# Patient Record
Sex: Female | Born: 1965 | Race: White | Hispanic: No | Smoking: Current every day smoker
Health system: Southern US, Community
[De-identification: ages and names within clinical notes are randomized; demographics above are authoritative.]

## PROBLEM LIST (undated history)

## (undated) HISTORY — PX: CERVICAL FUSION: SHX112

## (undated) HISTORY — PX: ABDOMINAL HYSTERECTOMY: SHX81

---

## 2005-06-16 ENCOUNTER — Ambulatory Visit (HOSPITAL_COMMUNITY): Admission: RE | Admit: 2005-06-16 | Discharge: 2005-06-16 | Payer: Self-pay | Admitting: Neurological Surgery

## 2005-07-18 ENCOUNTER — Encounter: Admission: RE | Admit: 2005-07-18 | Discharge: 2005-07-18 | Payer: Self-pay | Admitting: Neurological Surgery

## 2009-12-31 ENCOUNTER — Encounter: Admission: RE | Admit: 2009-12-31 | Discharge: 2009-12-31 | Payer: Self-pay | Admitting: Family Medicine

## 2010-04-29 ENCOUNTER — Encounter
Admission: RE | Admit: 2010-04-29 | Discharge: 2010-04-29 | Payer: Self-pay | Source: Home / Self Care | Attending: Family Medicine | Admitting: Family Medicine

## 2010-05-04 ENCOUNTER — Encounter
Admission: RE | Admit: 2010-05-04 | Discharge: 2010-05-04 | Payer: Self-pay | Source: Home / Self Care | Attending: Family Medicine | Admitting: Family Medicine

## 2010-05-10 ENCOUNTER — Other Ambulatory Visit: Payer: Self-pay | Admitting: Family Medicine

## 2010-05-10 DIAGNOSIS — M47819 Spondylosis without myelopathy or radiculopathy, site unspecified: Secondary | ICD-10-CM

## 2010-05-18 ENCOUNTER — Ambulatory Visit
Admission: RE | Admit: 2010-05-18 | Discharge: 2010-05-18 | Disposition: A | Discharge status: Home / Self Care | Payer: BC Managed Care – PPO | Source: Ambulatory Visit | Attending: Family Medicine | Admitting: Family Medicine

## 2010-05-18 DIAGNOSIS — M47819 Spondylosis without myelopathy or radiculopathy, site unspecified: Secondary | ICD-10-CM

## 2010-07-05 ENCOUNTER — Other Ambulatory Visit: Payer: Self-pay | Admitting: Family Medicine

## 2010-07-05 DIAGNOSIS — M47819 Spondylosis without myelopathy or radiculopathy, site unspecified: Secondary | ICD-10-CM

## 2010-07-05 DIAGNOSIS — M5416 Radiculopathy, lumbar region: Secondary | ICD-10-CM

## 2010-07-06 ENCOUNTER — Ambulatory Visit
Admission: RE | Admit: 2010-07-06 | Discharge: 2010-07-06 | Disposition: A | Discharge status: Home / Self Care | Payer: Self-pay | Source: Ambulatory Visit | Attending: Family Medicine | Admitting: Family Medicine

## 2010-07-06 DIAGNOSIS — M5416 Radiculopathy, lumbar region: Secondary | ICD-10-CM

## 2010-07-06 DIAGNOSIS — M47819 Spondylosis without myelopathy or radiculopathy, site unspecified: Secondary | ICD-10-CM

## 2010-08-27 NOTE — Op Note (Signed)
NAMETANGANYIKA, BOWLDS              ACCOUNT NO.:  [id number]   MEDICAL RECORD NO.:  [id number]          PATIENT TYPE:  OIB   LOCATION:  3007                         FACILITY:  [facility]   PHYSICIAN:  Tia Alert, MD     DATE OF BIRTH:  Jul 28, 1965   DATE OF PROCEDURE:  06/16/2005  DATE OF DISCHARGE:                                 OPERATIVE REPORT   PREOPERATIVE DIAGNOSIS:  Cervical spondylosis with degenerative disease and  small disk herniation, C5-6 with neck pain and left arm symptoms.   POSTOP DIAGNOSIS:  Cervical spondylosis with degenerative disease and small  disk herniation, C5-6 with neck pain and left arm symptoms.   PROCEDURE:  1.  Decompressive anterior cervical diskectomy, C5-6.  2.  Anterior cervical arthrodesis, C5-6 utilizing a 6 mm trabecular metal      cage packed with local autograft and DBX putty.  3.  Anterior cervical plating C5-6 utilizing a 23-mm Atlantis Vision plate.   SURGEON:  Marikay Alar.   ASSISTANT:  Dr. Aliene Beams   ANESTHESIA:  General endotracheal.   COMPLICATIONS:  None apparent.   INDICATIONS FOR PROCEDURE:  Ms. Onofrio is a 45 year old white female who  is referred with neck pain and left arm numbness and some pain. She had been  followed for over a year with medical management without significant relief.  She had MRI which showed spondylosis with degenerative disease with modic  endplate changes at C5-6 with a small foraminal disk herniation to the left.  I recommended anterior cervical diskectomy and fusion with plating at C5-6.  She understood the risks, benefits, expected outcome and wished to proceed.   DESCRIPTION OF PROCEDURE:  The patient was taken to the operating room after  induction of adequate generalized endotracheal anesthesia, she was placed in  supine position on the operating room table.  Her right anterior cervical  region was prepped with DuraPrep and draped in usual sterile fashion. 5 mL  local anesthesia was  injected and a small incision was made to the right of  midline and carried down to the platysma muscle which was elevated, opened  and undermined with Metzenbaum scissors. I then dissected a plane the medial  to the sternocleidomastoid muscle, internal carotid artery and lateral to  the trachea and esophagus to expose C5-6 and after intraoperative  fluoroscopy confirmed my level and the longus colli muscles taken down and  shadow line retractors were placed, the annulus at C5-6 was incised with a  15 blade scalpel and initial diskectomy was done with pituitary rongeurs and  curved curettes.  I then used the mucous trap to catch the shavings as I  drilled the endplates down to the level of the posterior longitudinal  ligament.  The operating micro microscope was brought onto the field and the  posterior longitudinal ligament was opened with a nerve hook and then  removed in a circumferential fashion along with posterior osteophytes at C5-  6.  Bilateral foraminotomies were performed but particular attention was  paid to the left side where she had her symptoms. The C6 nerve was  identified and  followed out past the pedicle level and generous foraminotomy  was performed.  The central canal was well decompressed.  The dura was  capacious and not pushed away.  We then irrigated with saline solution,  inspected our decompression once again, measured our interspace to be 6 mm  and then used a 6.14 x 11-mm trabecular cage which was packed with local  autograft and DBX putty and tapped into position at C5-6. An anterior  cervical plate was then used.  We used a 23-mm Atlantis Vision plate and  placed two 13 mm variable angle screws in the bodies of C5-C6 and locked  these into position with locking mechanism on the plate. We then irrigated  with saline solution containing bacitracin, dried all bleeding points with  bipolar cautery and once meticulous hemostasis was achieved, we closed the   platysma with 3-0 Vicryl, closed the subcuticular tissue with 3-0 Vicryl and  closed skin with Benzoin and Steri-Strips. The drapes were removed. A  sterile dressing was applied. The patient was awakened from general  anesthesia, transferred to recovery room in stable condition. At the end of  procedure all sponge, needle and instrument counts were correct.      Tia Alert, MD  Electronically Signed     DSJ/MEDQ  D:  06/16/2005  T:  06/16/2005  Job:  [id number]

## 2012-01-24 ENCOUNTER — Ambulatory Visit: Payer: Self-pay | Admitting: Internal Medicine

## 2012-01-24 LAB — DOT URINE DIP
Blood: NEGATIVE
Glucose,UR: NEGATIVE mg/dL (ref 0–75)
Specific Gravity: 1.005 (ref 1.003–1.030)

## 2013-02-13 ENCOUNTER — Other Ambulatory Visit: Payer: Self-pay | Admitting: Neurological Surgery

## 2013-02-13 DIAGNOSIS — M549 Dorsalgia, unspecified: Secondary | ICD-10-CM

## 2013-02-22 ENCOUNTER — Ambulatory Visit
Admission: RE | Admit: 2013-02-22 | Discharge: 2013-02-22 | Disposition: A | Discharge status: Home / Self Care | Payer: BC Managed Care – PPO | Source: Ambulatory Visit | Attending: Neurological Surgery | Admitting: Neurological Surgery

## 2013-02-22 DIAGNOSIS — M549 Dorsalgia, unspecified: Secondary | ICD-10-CM

## 2013-05-01 ENCOUNTER — Other Ambulatory Visit: Payer: Self-pay | Admitting: Anesthesiology

## 2013-05-01 DIAGNOSIS — M549 Dorsalgia, unspecified: Secondary | ICD-10-CM

## 2013-05-06 ENCOUNTER — Ambulatory Visit
Admission: RE | Admit: 2013-05-06 | Discharge: 2013-05-06 | Disposition: A | Discharge status: Home / Self Care | Payer: BC Managed Care – PPO | Source: Ambulatory Visit | Attending: Anesthesiology | Admitting: Anesthesiology

## 2013-05-06 DIAGNOSIS — M549 Dorsalgia, unspecified: Secondary | ICD-10-CM

## 2014-01-03 ENCOUNTER — Emergency Department (HOSPITAL_COMMUNITY)
Admission: EM | Admit: 2014-01-03 | Discharge: 2014-01-03 | Disposition: A | Discharge status: Home / Self Care | Payer: BC Managed Care – PPO | Attending: Emergency Medicine | Admitting: Emergency Medicine

## 2014-01-03 ENCOUNTER — Encounter (HOSPITAL_COMMUNITY): Payer: Self-pay | Admitting: Emergency Medicine

## 2014-01-03 DIAGNOSIS — Z791 Long term (current) use of non-steroidal anti-inflammatories (NSAID): Secondary | ICD-10-CM | POA: Diagnosis not present

## 2014-01-03 DIAGNOSIS — IMO0002 Reserved for concepts with insufficient information to code with codable children: Secondary | ICD-10-CM | POA: Diagnosis not present

## 2014-01-03 DIAGNOSIS — F17200 Nicotine dependence, unspecified, uncomplicated: Secondary | ICD-10-CM | POA: Diagnosis not present

## 2014-01-03 DIAGNOSIS — R599 Enlarged lymph nodes, unspecified: Secondary | ICD-10-CM | POA: Insufficient documentation

## 2014-01-03 DIAGNOSIS — H9209 Otalgia, unspecified ear: Secondary | ICD-10-CM | POA: Insufficient documentation

## 2014-01-03 DIAGNOSIS — J3489 Other specified disorders of nose and nasal sinuses: Secondary | ICD-10-CM | POA: Insufficient documentation

## 2014-01-03 DIAGNOSIS — R591 Generalized enlarged lymph nodes: Secondary | ICD-10-CM

## 2014-01-03 NOTE — ED Provider Notes (Signed)
CSN: [id number]     Arrival date & time 01/03/14  1657 History  This chart was scribed for non-physician practitioner working with Richarda Blade, MD by Mercy Moore, ED Scribe. This patient was seen in room TR05C/TR05C and the patient's care was started at 5:49 PM.   Chief Complaint  Patient presents with  . Otalgia   The history is provided by the patient. No language interpreter was used.   HPI Comments: Shirley Le is a 48 y.o. female who presents to the Emergency Department with chief compliant of throbbing right ear pain for two weeks now. Patient reports sensation of fluid in her left ear. Patient has been treating her symptoms with Oxycodone and Prednisone prescribed at ED in [place]. Patient reports diagnosis of inflamed left lymph node. Last night patient applied a heating pad and reports mild relief. Patient reports rhinorrhea, but denies other cold symptoms including cough and congestion. Patient has appointment with ENT specialist next week. Patient reports history of tubes in her ear. She denies history of Diabetes.   History reviewed. No pertinent past medical history. Past Surgical History  Procedure Laterality Date  . Abdominal hysterectomy    . Cervical fusion     No family history on file. History  Substance Use Topics  . Smoking status: Current Every Day Smoker  . Smokeless tobacco: Not on file  . Alcohol Use: Yes   OB History   Grav Para Term Preterm Abortions TAB SAB Ect Mult Living                 Review of Systems  Constitutional: Negative for fever and chills.  HENT: Positive for ear pain and rhinorrhea. Negative for ear discharge.   Respiratory: Negative for cough.   Hematological: Positive for adenopathy.    Allergies  Chocolate and Codeine  Home Medications   Prior to Admission medications   Medication Sig Start Date End Date Taking? Authorizing Provider  cyclobenzaprine (FLEXERIL) 10 MG tablet Take 10 mg by mouth 3 (three) times daily  as needed for muscle spasms.   Yes Historical Provider, MD  Isopropyl Alcohol (SWIMMERS EAR DROPS OT) Place 4 drops in ear(s) every 6 (six) hours as needed.   Yes Historical Provider, MD  meloxicam (MOBIC) 7.5 MG tablet Take 7.5 mg by mouth daily.   Yes Historical Provider, MD  oxyCODONE-acetaminophen (PERCOCET/ROXICET) 5-325 MG per tablet Take 1 tablet by mouth every 6 (six) hours as needed for severe pain.   Yes Historical Provider, MD  PredniSONE 10 MG KIT Take 10 mg by mouth daily. Day 1: 2 tablets before breakfast, 1 tablet at lunch and supper time, 2 tablets at bedtime.  Day 2: 1 tablet before breakfast, 1 table at lunch and supper time, 2 tablets at bedtime.  Day 3: 1 tablet before breakfast, 1 tablet at lunch and supper time, 1 table at bedtime. Day 4: 1 tablet before breakfast, 1 tablet at lunch and supper time, 1 tablet at bedtime.  Day 5: 1 tablet before breakfast, and at bedtime.  Day 6: 1 tablet before breakfast.   Yes Historical Provider, MD   Triage Vitals: BP 127/83  Pulse 94  Temp(Src) 98.6 F (37 C) (Oral)  Resp 20  SpO2 99%  Physical Exam  Nursing note and vitals reviewed. Constitutional: She is oriented to person, place, and time. She appears well-developed and well-nourished. No distress.  HENT:  Head: Normocephalic and atraumatic.  Bilateral TM are clear, some scarring in noted on left TM,  not fluid collection. Ear canal is clear.  No mastoid tenderness. No erythema, cellulitis or abscess.   Eyes: EOM are normal.  Neck: Normal range of motion. Neck supple.  Left anterior cervical chain lymphadenopathy.   Cardiovascular: Normal rate.   Pulmonary/Chest: Effort normal. No respiratory distress.  Musculoskeletal: Normal range of motion.  Lymphadenopathy:    She has cervical adenopathy.  Neurological: She is alert and oriented to person, place, and time.  Skin: Skin is warm and dry.  Psychiatric: She has a normal mood and affect. Her behavior is normal.    ED Course   Procedures (including critical care time)  COORDINATION OF CARE: 5:55 PM- Patient advised to continue warm compresses and to keep her appointment with the ENT specialist. Patient given return precautions. Discussed treatment plan with patient at bedside and patient agreed to plan.   Labs Review Labs Reviewed - No data to display  Imaging Review No results found.   EKG Interpretation None      MDM   Final diagnoses:  Lymphadenopathy    Patient with tender left anterior cervical chain adenopathy.  No acute findings on ENT exam.  No abscess, TM abnormality.  Has tried prednisone and percocet with no relief.  She has ENT follow-up on Monday.  States that warm compresses help.  Encouraged these for the next couple of days.  I personally performed the services described in this documentation, which was scribed in my presence. The recorded information has been reviewed and is accurate.    Montine Circle, PA-C 01/03/14 1806

## 2014-01-03 NOTE — ED Notes (Signed)
Extreme ear pain. taking oxycodone, prednisone, and an antibiotic.  Started using ear drops for swimmers ear and not working.

## 2014-01-03 NOTE — Discharge Instructions (Signed)

## 2014-01-03 NOTE — ED Provider Notes (Signed)
Medical screening examination/treatment/procedure(s) were performed by non-physician practitioner and as supervising physician I was immediately available for consultation/collaboration.  Jarryd Gratz L Benicio Manna, MD 01/03/14 2325 

## 2014-04-13 IMAGING — CT CT L SPINE W/O CM
4 of 7 series · 12 of 33 positions shown, 14 images · non-contrast
Comparison: MR JIM SPINE W/O dated 02/22/2013

CLINICAL DATA: Diskogram. Low back pain for 3 months. Numbness in
both feet.

EXAM:
CT LUMBAR SPINE WITHOUT CONTRAST
TECHNIQUE: Multidetector CT imaging of the lumbar spine was performed without
intravenous contrast administration. Multiplanar CT image
reconstructions were also generated.

[Series 2: l spine bone · axial · 0.26mm/px · z∈[-57,-17]mm · 2 of 48 slices shown, 3 images]
[im 16/48  soft-tissue]
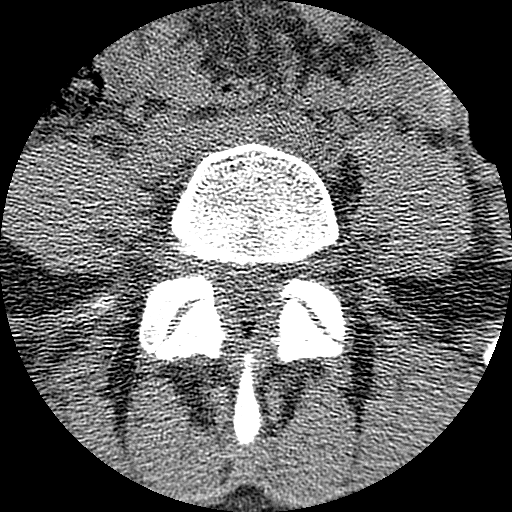
[im 16/48  bone]
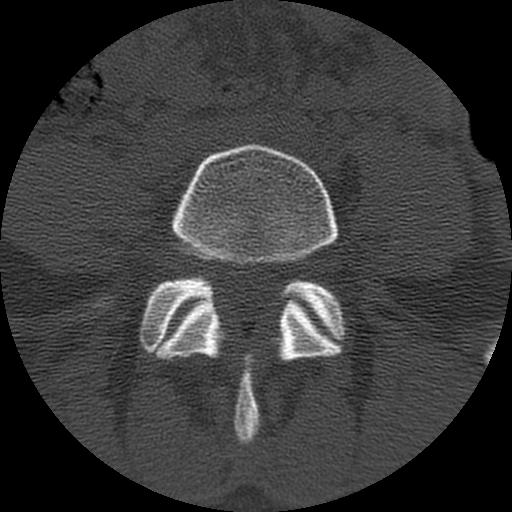
[im 32/48  bone]
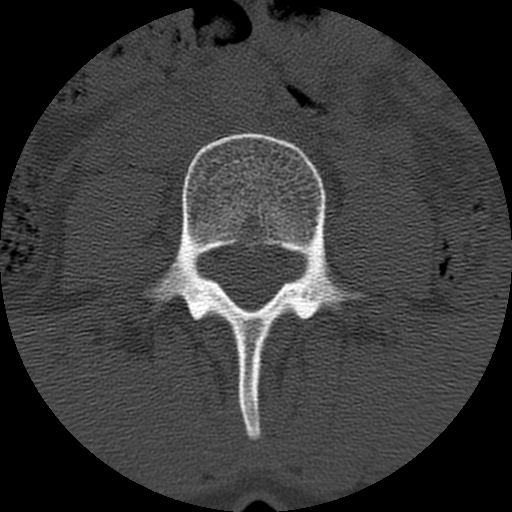

[Series 3: l spine soft · axial · 0.26mm/px · z∈[-57,-17]mm · 2 of 48 slices shown]
[im 16/48  soft-tissue]
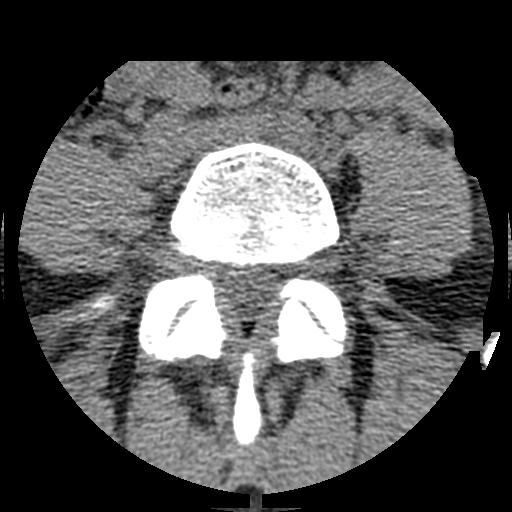
[im 32/48  soft-tissue]
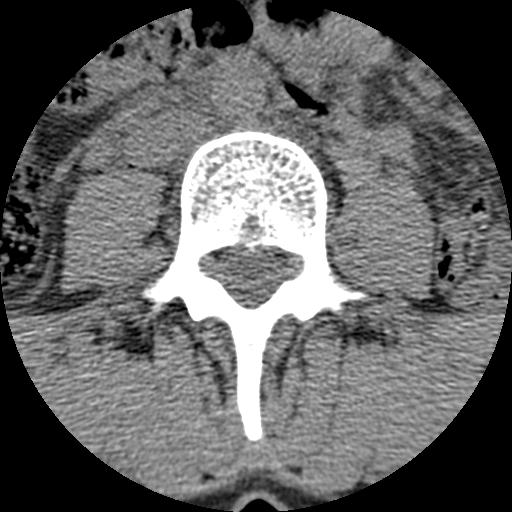

[Series 400: sagittal · sagittal · 0.26mm/px · 5 of 42 slices shown, 6 images]
[im 14/42  bone]
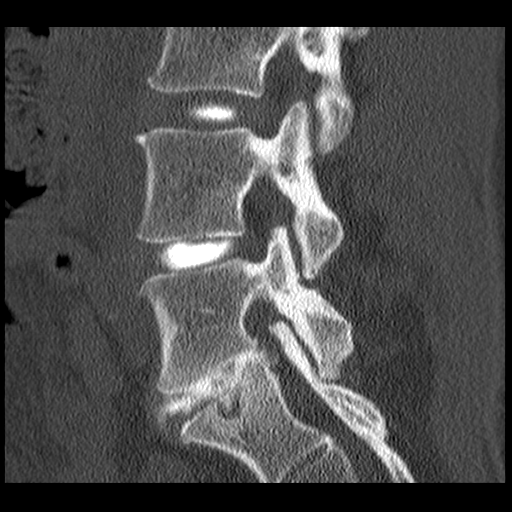
[im 18/42  bone]
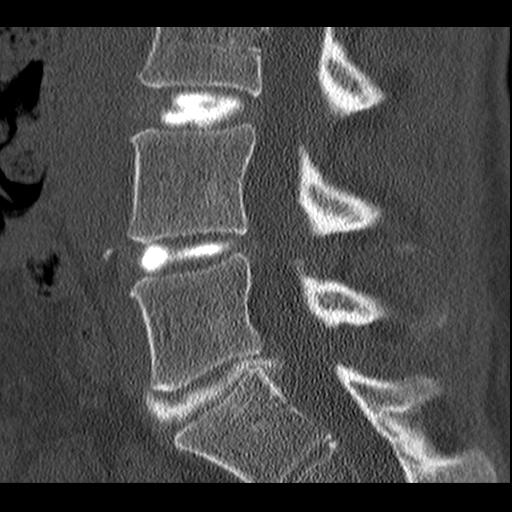
[im 21/42  soft-tissue]
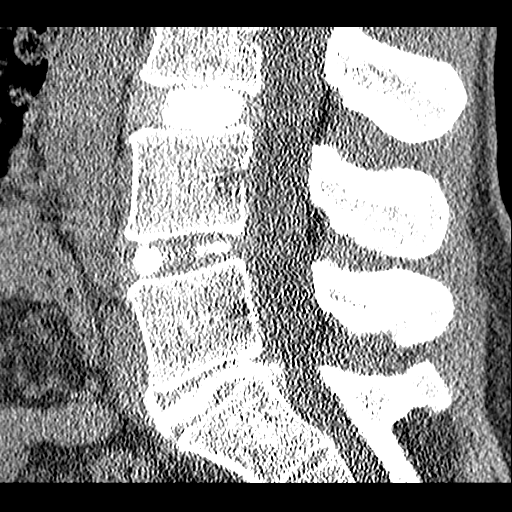
[im 21/42  bone]
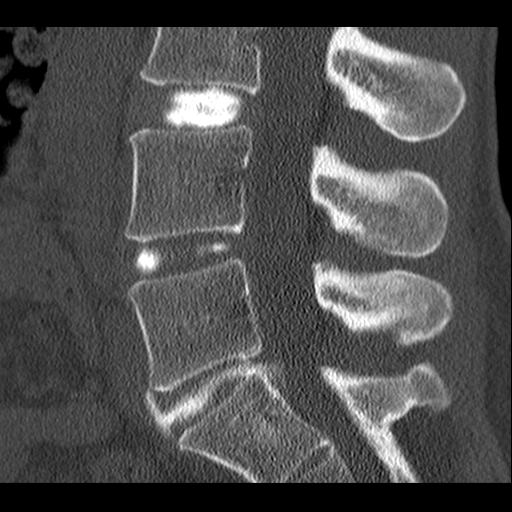
[im 24/42  bone]
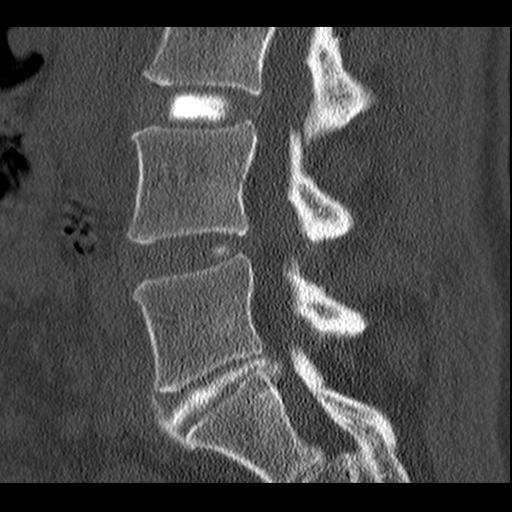
[im 28/42  bone]
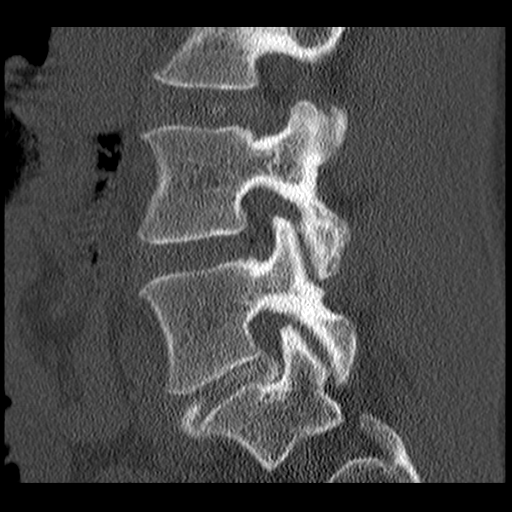

[Series 401: coronal · coronal · 0.26mm/px · 3 of 51 slices shown]
[im 11/51  bone]
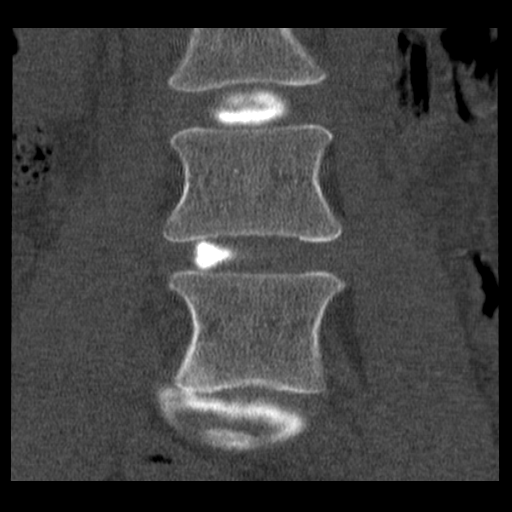
[im 21/51  bone]
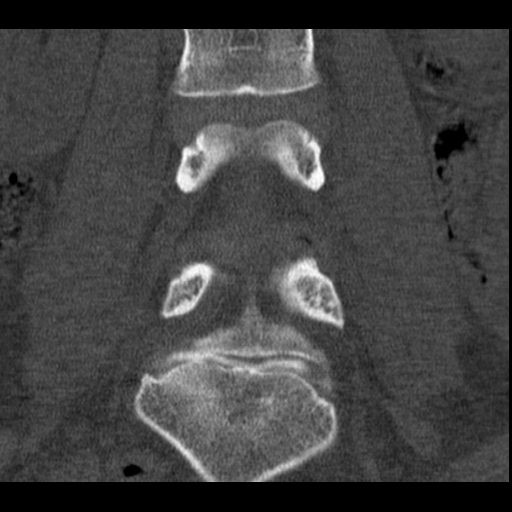
[im 31/51  bone]
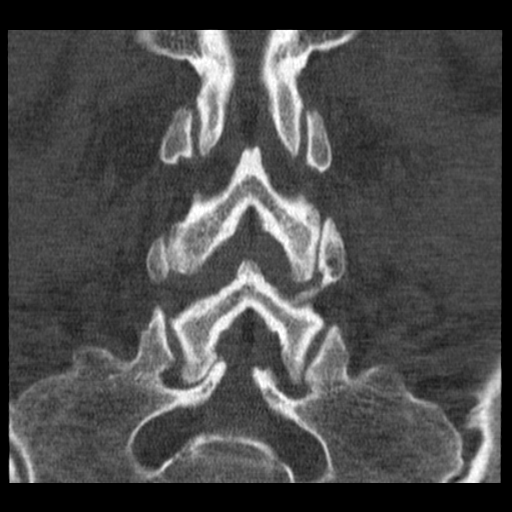

[12 of 33 positions shown; findings below may reference images not displayed]

FINDINGS: Imaging was performed from L3 through S1. Contrast had been injected
into the discs at L3 -L4, L4-L5 and L5-S1 previously. Paraspinal
soft tissues demonstrate atherosclerosis.

L3-L4: Normal nuclear opacification. No extravasation. Tiny amount
of vacuum disc adjacent to the L3 inferior endplate on the left. No
extravasation of contrast. Minimal disc bulging without stenosis.

L4-L5: Good nuclear opacification. The side of injection is unknown.
Tiny locules of gas are present in the right posterior lateral and
left foraminal region. A portion of the injection appears to be
annular on the right side however there is some nuclear
opacification. Mild disc space narrowing. There is no bony stenosis.
There is grade II fissuring of the anulus in the right foraminal
region.

L5-S1: Severe disc degeneration with a in the S1 superior endplate
Schmorl's node on the right. There is circumferential annular
tearing with loss of disc height. Grade IV annular tearing is
present diffusely both anteriorly and posteriorly. Broad-based
posterior disc bulging extends into both neural foramina. There is
right greater than left foraminal stenosis secondary to bulging disc
and endplate spurring. The central canal appears patent. Broad-based
posterior disc bulging produces mild bilateral lateral recess
stenosis.
IMPRESSION: 1. L3-L4 normal nuclear opacification.
2. Grade II annular fissuring at L4-L5.
3. Severe L5-S1 disc degeneration with circumferential annular
tearing. grade IV anterior and posterior annular tearing.
Broad-based posterior disc bulging with mild right foraminal
stenosis. Mild bilateral lateral recess encroachment associated with
broad-based disc bulging.

## 2020-10-03 DIAGNOSIS — B009 Herpesviral infection, unspecified: Secondary | ICD-10-CM | POA: Diagnosis not present

## 2020-10-03 DIAGNOSIS — R590 Localized enlarged lymph nodes: Secondary | ICD-10-CM | POA: Diagnosis not present

## 2020-10-03 DIAGNOSIS — F17210 Nicotine dependence, cigarettes, uncomplicated: Secondary | ICD-10-CM | POA: Diagnosis not present

## 2020-10-04 DIAGNOSIS — Z90710 Acquired absence of both cervix and uterus: Secondary | ICD-10-CM | POA: Diagnosis not present

## 2020-10-04 DIAGNOSIS — R109 Unspecified abdominal pain: Secondary | ICD-10-CM | POA: Diagnosis not present

## 2020-10-04 DIAGNOSIS — R509 Fever, unspecified: Secondary | ICD-10-CM | POA: Diagnosis not present

## 2020-10-04 DIAGNOSIS — I700 Atherosclerosis of aorta: Secondary | ICD-10-CM | POA: Diagnosis not present

## 2020-10-04 DIAGNOSIS — L0201 Cutaneous abscess of face: Secondary | ICD-10-CM | POA: Diagnosis not present

## 2020-10-04 DIAGNOSIS — B9689 Other specified bacterial agents as the cause of diseases classified elsewhere: Secondary | ICD-10-CM | POA: Diagnosis not present

## 2020-10-04 DIAGNOSIS — R1084 Generalized abdominal pain: Secondary | ICD-10-CM | POA: Diagnosis not present

## 2020-10-20 DIAGNOSIS — M5412 Radiculopathy, cervical region: Secondary | ICD-10-CM | POA: Diagnosis not present

## 2020-10-20 DIAGNOSIS — Z6821 Body mass index (BMI) 21.0-21.9, adult: Secondary | ICD-10-CM | POA: Diagnosis not present

## 2020-10-20 DIAGNOSIS — Z0000 Encounter for general adult medical examination without abnormal findings: Secondary | ICD-10-CM | POA: Diagnosis not present

## 2020-10-20 DIAGNOSIS — Z1231 Encounter for screening mammogram for malignant neoplasm of breast: Secondary | ICD-10-CM | POA: Diagnosis not present

## 2020-10-20 DIAGNOSIS — Z87891 Personal history of nicotine dependence: Secondary | ICD-10-CM | POA: Diagnosis not present

## 2020-10-20 DIAGNOSIS — Z1331 Encounter for screening for depression: Secondary | ICD-10-CM | POA: Diagnosis not present

## 2020-10-20 DIAGNOSIS — B001 Herpesviral vesicular dermatitis: Secondary | ICD-10-CM | POA: Diagnosis not present

## 2020-10-20 DIAGNOSIS — Z1211 Encounter for screening for malignant neoplasm of colon: Secondary | ICD-10-CM | POA: Diagnosis not present

## 2020-12-28 DIAGNOSIS — Z6821 Body mass index (BMI) 21.0-21.9, adult: Secondary | ICD-10-CM | POA: Diagnosis not present

## 2020-12-28 DIAGNOSIS — Z20822 Contact with and (suspected) exposure to covid-19: Secondary | ICD-10-CM | POA: Diagnosis not present

## 2020-12-28 DIAGNOSIS — J189 Pneumonia, unspecified organism: Secondary | ICD-10-CM | POA: Diagnosis not present

## 2021-01-04 DIAGNOSIS — J189 Pneumonia, unspecified organism: Secondary | ICD-10-CM | POA: Diagnosis not present

## 2021-01-21 DIAGNOSIS — R21 Rash and other nonspecific skin eruption: Secondary | ICD-10-CM | POA: Diagnosis not present

## 2021-01-21 DIAGNOSIS — Z6822 Body mass index (BMI) 22.0-22.9, adult: Secondary | ICD-10-CM | POA: Diagnosis not present

## 2021-01-21 DIAGNOSIS — F419 Anxiety disorder, unspecified: Secondary | ICD-10-CM | POA: Diagnosis not present

## 2021-02-01 DIAGNOSIS — Z1331 Encounter for screening for depression: Secondary | ICD-10-CM | POA: Diagnosis not present

## 2021-02-01 DIAGNOSIS — Z0000 Encounter for general adult medical examination without abnormal findings: Secondary | ICD-10-CM | POA: Diagnosis not present

## 2021-02-01 DIAGNOSIS — Z9181 History of falling: Secondary | ICD-10-CM | POA: Diagnosis not present

## 2021-02-14 DIAGNOSIS — R0602 Shortness of breath: Secondary | ICD-10-CM | POA: Diagnosis not present

## 2021-02-14 DIAGNOSIS — J0100 Acute maxillary sinusitis, unspecified: Secondary | ICD-10-CM | POA: Diagnosis not present

## 2021-02-14 DIAGNOSIS — R051 Acute cough: Secondary | ICD-10-CM | POA: Diagnosis not present

## 2021-02-14 DIAGNOSIS — J209 Acute bronchitis, unspecified: Secondary | ICD-10-CM | POA: Diagnosis not present

## 2021-02-14 DIAGNOSIS — F17200 Nicotine dependence, unspecified, uncomplicated: Secondary | ICD-10-CM | POA: Diagnosis not present

## 2021-02-22 DIAGNOSIS — J209 Acute bronchitis, unspecified: Secondary | ICD-10-CM | POA: Diagnosis not present

## 2021-02-22 DIAGNOSIS — R21 Rash and other nonspecific skin eruption: Secondary | ICD-10-CM | POA: Diagnosis not present

## 2021-02-22 DIAGNOSIS — F419 Anxiety disorder, unspecified: Secondary | ICD-10-CM | POA: Diagnosis not present

## 2021-02-22 DIAGNOSIS — Z6823 Body mass index (BMI) 23.0-23.9, adult: Secondary | ICD-10-CM | POA: Diagnosis not present

## 2021-02-22 DIAGNOSIS — F17200 Nicotine dependence, unspecified, uncomplicated: Secondary | ICD-10-CM | POA: Diagnosis not present

## 2021-07-16 DIAGNOSIS — Z1231 Encounter for screening mammogram for malignant neoplasm of breast: Secondary | ICD-10-CM | POA: Diagnosis not present

## 2021-07-20 DIAGNOSIS — Z6825 Body mass index (BMI) 25.0-25.9, adult: Secondary | ICD-10-CM | POA: Diagnosis not present

## 2021-07-20 DIAGNOSIS — R200 Anesthesia of skin: Secondary | ICD-10-CM | POA: Diagnosis not present

## 2021-07-20 DIAGNOSIS — R202 Paresthesia of skin: Secondary | ICD-10-CM | POA: Diagnosis not present

## 2021-07-20 DIAGNOSIS — Z85828 Personal history of other malignant neoplasm of skin: Secondary | ICD-10-CM | POA: Diagnosis not present

## 2021-07-20 DIAGNOSIS — R3129 Other microscopic hematuria: Secondary | ICD-10-CM | POA: Diagnosis not present

## 2021-07-20 DIAGNOSIS — Z981 Arthrodesis status: Secondary | ICD-10-CM | POA: Diagnosis not present

## 2021-07-20 DIAGNOSIS — Z5181 Encounter for therapeutic drug level monitoring: Secondary | ICD-10-CM | POA: Diagnosis not present

## 2021-07-20 DIAGNOSIS — E559 Vitamin D deficiency, unspecified: Secondary | ICD-10-CM | POA: Diagnosis not present

## 2021-07-20 DIAGNOSIS — M5412 Radiculopathy, cervical region: Secondary | ICD-10-CM | POA: Diagnosis not present

## 2021-07-20 DIAGNOSIS — M5136 Other intervertebral disc degeneration, lumbar region: Secondary | ICD-10-CM | POA: Diagnosis not present

## 2021-07-20 DIAGNOSIS — Z13220 Encounter for screening for lipoid disorders: Secondary | ICD-10-CM | POA: Diagnosis not present

## 2021-07-20 DIAGNOSIS — Z131 Encounter for screening for diabetes mellitus: Secondary | ICD-10-CM | POA: Diagnosis not present

## 2021-07-21 DIAGNOSIS — R3129 Other microscopic hematuria: Secondary | ICD-10-CM | POA: Diagnosis not present

## 2021-07-27 DIAGNOSIS — R3129 Other microscopic hematuria: Secondary | ICD-10-CM | POA: Diagnosis not present

## 2021-08-02 DIAGNOSIS — R3129 Other microscopic hematuria: Secondary | ICD-10-CM | POA: Diagnosis not present

## 2021-08-02 DIAGNOSIS — Z981 Arthrodesis status: Secondary | ICD-10-CM | POA: Diagnosis not present

## 2021-08-02 DIAGNOSIS — M5412 Radiculopathy, cervical region: Secondary | ICD-10-CM | POA: Diagnosis not present

## 2021-08-02 DIAGNOSIS — M542 Cervicalgia: Secondary | ICD-10-CM | POA: Diagnosis not present

## 2021-08-02 DIAGNOSIS — Z6825 Body mass index (BMI) 25.0-25.9, adult: Secondary | ICD-10-CM | POA: Diagnosis not present

## 2021-08-11 DIAGNOSIS — M4712 Other spondylosis with myelopathy, cervical region: Secondary | ICD-10-CM | POA: Diagnosis not present

## 2021-08-11 DIAGNOSIS — M2578 Osteophyte, vertebrae: Secondary | ICD-10-CM | POA: Diagnosis not present

## 2021-08-11 DIAGNOSIS — M5011 Cervical disc disorder with radiculopathy,  high cervical region: Secondary | ICD-10-CM | POA: Diagnosis not present

## 2021-08-11 DIAGNOSIS — M5021 Other cervical disc displacement,  high cervical region: Secondary | ICD-10-CM | POA: Diagnosis not present

## 2021-08-11 DIAGNOSIS — M9973 Connective tissue and disc stenosis of intervertebral foramina of lumbar region: Secondary | ICD-10-CM | POA: Diagnosis not present

## 2021-08-11 DIAGNOSIS — M47812 Spondylosis without myelopathy or radiculopathy, cervical region: Secondary | ICD-10-CM | POA: Diagnosis not present

## 2021-08-23 DIAGNOSIS — Z981 Arthrodesis status: Secondary | ICD-10-CM | POA: Diagnosis not present

## 2021-08-23 DIAGNOSIS — Z6827 Body mass index (BMI) 27.0-27.9, adult: Secondary | ICD-10-CM | POA: Diagnosis not present

## 2021-08-23 DIAGNOSIS — M5412 Radiculopathy, cervical region: Secondary | ICD-10-CM | POA: Diagnosis not present

## 2021-08-24 DIAGNOSIS — R7989 Other specified abnormal findings of blood chemistry: Secondary | ICD-10-CM | POA: Diagnosis not present

## 2021-08-24 DIAGNOSIS — Z0000 Encounter for general adult medical examination without abnormal findings: Secondary | ICD-10-CM | POA: Diagnosis not present

## 2021-08-24 DIAGNOSIS — K5900 Constipation, unspecified: Secondary | ICD-10-CM | POA: Diagnosis not present

## 2021-08-24 DIAGNOSIS — F419 Anxiety disorder, unspecified: Secondary | ICD-10-CM | POA: Diagnosis not present

## 2021-08-24 DIAGNOSIS — M5412 Radiculopathy, cervical region: Secondary | ICD-10-CM | POA: Diagnosis not present

## 2021-08-24 DIAGNOSIS — N898 Other specified noninflammatory disorders of vagina: Secondary | ICD-10-CM | POA: Diagnosis not present

## 2021-08-24 DIAGNOSIS — D509 Iron deficiency anemia, unspecified: Secondary | ICD-10-CM | POA: Diagnosis not present

## 2021-08-24 DIAGNOSIS — Z720 Tobacco use: Secondary | ICD-10-CM | POA: Diagnosis not present

## 2021-08-24 DIAGNOSIS — E782 Mixed hyperlipidemia: Secondary | ICD-10-CM | POA: Diagnosis not present

## 2021-09-17 DIAGNOSIS — Z79899 Other long term (current) drug therapy: Secondary | ICD-10-CM | POA: Diagnosis not present

## 2021-09-17 DIAGNOSIS — R7989 Other specified abnormal findings of blood chemistry: Secondary | ICD-10-CM | POA: Diagnosis not present

## 2021-10-18 DIAGNOSIS — R200 Anesthesia of skin: Secondary | ICD-10-CM | POA: Diagnosis not present

## 2021-10-18 DIAGNOSIS — M5412 Radiculopathy, cervical region: Secondary | ICD-10-CM | POA: Diagnosis not present

## 2021-10-20 DIAGNOSIS — E069 Thyroiditis, unspecified: Secondary | ICD-10-CM | POA: Diagnosis not present

## 2021-10-25 DIAGNOSIS — R531 Weakness: Secondary | ICD-10-CM | POA: Diagnosis not present

## 2021-10-25 DIAGNOSIS — M79641 Pain in right hand: Secondary | ICD-10-CM | POA: Diagnosis not present

## 2021-10-25 DIAGNOSIS — M79642 Pain in left hand: Secondary | ICD-10-CM | POA: Diagnosis not present

## 2021-10-25 DIAGNOSIS — R200 Anesthesia of skin: Secondary | ICD-10-CM | POA: Diagnosis not present

## 2021-12-23 DIAGNOSIS — L578 Other skin changes due to chronic exposure to nonionizing radiation: Secondary | ICD-10-CM | POA: Diagnosis not present

## 2021-12-23 DIAGNOSIS — Z85828 Personal history of other malignant neoplasm of skin: Secondary | ICD-10-CM | POA: Diagnosis not present

## 2021-12-23 DIAGNOSIS — D1801 Hemangioma of skin and subcutaneous tissue: Secondary | ICD-10-CM | POA: Diagnosis not present

## 2021-12-23 DIAGNOSIS — D225 Melanocytic nevi of trunk: Secondary | ICD-10-CM | POA: Diagnosis not present

## 2021-12-23 DIAGNOSIS — L570 Actinic keratosis: Secondary | ICD-10-CM | POA: Diagnosis not present

## 2021-12-23 DIAGNOSIS — L814 Other melanin hyperpigmentation: Secondary | ICD-10-CM | POA: Diagnosis not present

## 2021-12-23 DIAGNOSIS — L110 Acquired keratosis follicularis: Secondary | ICD-10-CM | POA: Diagnosis not present

## 2021-12-24 DIAGNOSIS — R82998 Other abnormal findings in urine: Secondary | ICD-10-CM | POA: Diagnosis not present

## 2021-12-24 DIAGNOSIS — Z1211 Encounter for screening for malignant neoplasm of colon: Secondary | ICD-10-CM | POA: Diagnosis not present

## 2021-12-24 DIAGNOSIS — R109 Unspecified abdominal pain: Secondary | ICD-10-CM | POA: Diagnosis not present

## 2021-12-24 DIAGNOSIS — K219 Gastro-esophageal reflux disease without esophagitis: Secondary | ICD-10-CM | POA: Diagnosis not present

## 2021-12-24 DIAGNOSIS — G4709 Other insomnia: Secondary | ICD-10-CM | POA: Diagnosis not present

## 2021-12-24 DIAGNOSIS — R140 Abdominal distension (gaseous): Secondary | ICD-10-CM | POA: Diagnosis not present

## 2021-12-24 DIAGNOSIS — K5900 Constipation, unspecified: Secondary | ICD-10-CM | POA: Diagnosis not present

## 2021-12-28 DIAGNOSIS — Z1211 Encounter for screening for malignant neoplasm of colon: Secondary | ICD-10-CM | POA: Diagnosis not present

## 2022-01-18 DIAGNOSIS — M5442 Lumbago with sciatica, left side: Secondary | ICD-10-CM | POA: Diagnosis not present

## 2022-01-18 DIAGNOSIS — M5441 Lumbago with sciatica, right side: Secondary | ICD-10-CM | POA: Diagnosis not present

## 2022-01-18 DIAGNOSIS — B351 Tinea unguium: Secondary | ICD-10-CM | POA: Diagnosis not present

## 2022-01-18 DIAGNOSIS — J069 Acute upper respiratory infection, unspecified: Secondary | ICD-10-CM | POA: Diagnosis not present

## 2022-01-18 DIAGNOSIS — G8929 Other chronic pain: Secondary | ICD-10-CM | POA: Diagnosis not present

## 2022-01-18 DIAGNOSIS — M25551 Pain in right hip: Secondary | ICD-10-CM | POA: Diagnosis not present

## 2022-01-18 DIAGNOSIS — M25552 Pain in left hip: Secondary | ICD-10-CM | POA: Diagnosis not present

## 2022-01-18 DIAGNOSIS — M47816 Spondylosis without myelopathy or radiculopathy, lumbar region: Secondary | ICD-10-CM | POA: Diagnosis not present

## 2022-01-18 DIAGNOSIS — M47817 Spondylosis without myelopathy or radiculopathy, lumbosacral region: Secondary | ICD-10-CM | POA: Diagnosis not present

## 2022-01-20 DIAGNOSIS — M5126 Other intervertebral disc displacement, lumbar region: Secondary | ICD-10-CM | POA: Diagnosis not present
